# Patient Record
Sex: Male | Born: 1985 | Race: Black or African American | Marital: Single | Smoking: Current every day smoker
Health system: Southern US, Community
[De-identification: ages and names within clinical notes are randomized; demographics above are authoritative.]

## PROBLEM LIST (undated history)

## (undated) DIAGNOSIS — F209 Schizophrenia, unspecified: Secondary | ICD-10-CM

## (undated) DIAGNOSIS — F22 Delusional disorders: Secondary | ICD-10-CM

---

## 2016-02-18 ENCOUNTER — Other Ambulatory Visit: Payer: Self-pay | Admitting: Family Medicine

## 2016-02-18 DIAGNOSIS — R591 Generalized enlarged lymph nodes: Secondary | ICD-10-CM

## 2016-02-26 ENCOUNTER — Ambulatory Visit
Admission: RE | Admit: 2016-02-26 | Discharge: 2016-02-26 | Disposition: A | Discharge status: Home / Self Care | Payer: Medicaid Other | Source: Ambulatory Visit | Attending: Family Medicine | Admitting: Family Medicine

## 2016-02-26 DIAGNOSIS — R591 Generalized enlarged lymph nodes: Secondary | ICD-10-CM | POA: Insufficient documentation

## 2016-03-10 ENCOUNTER — Emergency Department: Payer: Medicaid Other

## 2016-03-10 ENCOUNTER — Emergency Department
Admission: EM | Admit: 2016-03-10 | Discharge: 2016-03-10 | Disposition: A | Discharge status: Home / Self Care | Payer: Medicaid Other | Attending: Emergency Medicine | Admitting: Emergency Medicine

## 2016-03-10 ENCOUNTER — Encounter: Payer: Self-pay | Admitting: Emergency Medicine

## 2016-03-10 DIAGNOSIS — Y999 Unspecified external cause status: Secondary | ICD-10-CM | POA: Insufficient documentation

## 2016-03-10 DIAGNOSIS — Y939 Activity, unspecified: Secondary | ICD-10-CM | POA: Insufficient documentation

## 2016-03-10 DIAGNOSIS — F200 Paranoid schizophrenia: Secondary | ICD-10-CM | POA: Insufficient documentation

## 2016-03-10 DIAGNOSIS — Z79899 Other long term (current) drug therapy: Secondary | ICD-10-CM | POA: Diagnosis not present

## 2016-03-10 DIAGNOSIS — Y92410 Unspecified street and highway as the place of occurrence of the external cause: Secondary | ICD-10-CM | POA: Insufficient documentation

## 2016-03-10 DIAGNOSIS — S20219A Contusion of unspecified front wall of thorax, initial encounter: Secondary | ICD-10-CM | POA: Insufficient documentation

## 2016-03-10 DIAGNOSIS — S40012A Contusion of left shoulder, initial encounter: Secondary | ICD-10-CM

## 2016-03-10 DIAGNOSIS — F17200 Nicotine dependence, unspecified, uncomplicated: Secondary | ICD-10-CM | POA: Insufficient documentation

## 2016-03-10 DIAGNOSIS — M25512 Pain in left shoulder: Secondary | ICD-10-CM | POA: Diagnosis present

## 2016-03-10 DIAGNOSIS — T148XXA Other injury of unspecified body region, initial encounter: Secondary | ICD-10-CM

## 2016-03-10 HISTORY — DX: Delusional disorders: F22

## 2016-03-10 HISTORY — DX: Schizophrenia, unspecified: F20.9

## 2016-03-10 MED ORDER — IBUPROFEN 600 MG PO TABS: 600.0000 mg | ORAL_TABLET | Freq: Four times a day (QID) | ORAL | Status: DC | PRN

## 2016-03-10 MED ORDER — IBUPROFEN 600 MG PO TABS: 600.0000 mg | ORAL_TABLET | Freq: Four times a day (QID) | ORAL | Status: AC | PRN

## 2016-03-10 NOTE — Discharge Instructions (Signed)
Cryotherapy °Cryotherapy is when you put ice on your injury. Ice helps lessen pain and puffiness (swelling) after an injury. Ice works the best when you start using it in the first 24 to 48 hours after an injury. °HOME CARE °· Put a dry or damp towel between the ice pack and your skin. °· You may press gently on the ice pack. °· Leave the ice on for no more than 10 to 20 minutes at a time. °· Check your skin after 5 minutes to make sure your skin is okay. °· Rest at least 20 minutes between ice pack uses. °· Stop using ice when your skin loses feeling (numbness). °· Do not use ice on someone who cannot tell you when it hurts. This includes small children and people with memory problems (dementia). °GET HELP RIGHT AWAY IF: °· You have white spots on your skin. °· Your skin turns blue or pale. °· Your skin feels waxy or hard. °· Your puffiness gets worse. °MAKE SURE YOU:  °· Understand these instructions. °· Will watch your condition. °· Will get help right away if you are not doing well or get worse. °  °This information is not intended to replace advice given to you by your health care provider. Make sure you discuss any questions you have with your health care provider. °  °Document Released: 03/02/2008 Document Revised: 12/07/2011 Document Reviewed: 05/07/2011 °Elsevier Interactive Patient Education ©2016 [employer] ° °Contusion °A contusion is a deep bruise. Contusions happen when an injury causes bleeding under the skin. Symptoms of bruising include pain, swelling, and discolored skin. The skin may turn blue, purple, or yellow. °HOME CARE  °· Rest the injured area. °· If told, put ice on the injured area. °¨ Put ice in a plastic bag. °¨ Place a towel between your skin and the bag. °¨ Leave the ice on for 20 minutes, 2-3 times per day. °· If told, put light pressure (compression) on the injured area using an elastic bandage. Make sure the bandage is not too tight. Remove it and put it back on as told by your  doctor. °· If possible, raise (elevate) the injured area above the level of your heart while you are sitting or lying down. °· Take over-the-counter and prescription medicines only as told by your doctor. °GET HELP IF: °· Your symptoms do not get better after several days of treatment. °· Your symptoms get worse. °· You have trouble moving the injured area. °GET HELP RIGHT AWAY IF:  °· You have very bad pain. °· You have a loss of feeling (numbness) in a hand or foot. °· Your hand or foot turns pale or cold. °  °This information is not intended to replace advice given to you by your health care provider. Make sure you discuss any questions you have with your health care provider. °  °Document Released: 03/02/2008 Document Revised: 06/05/2015 Document Reviewed: 01/30/2015 °Elsevier Interactive Patient Education ©2016 [employer] ° °
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
## 2018-04-22 IMAGING — US US SOFT TISSUE HEAD/NECK
1 series · 12 of 12 positions shown · non-contrast
Comparison: None.

CLINICAL DATA: Right supraclavicular mass

EXAM:
ULTRASOUND OF HEAD/NECK SOFT TISSUES
TECHNIQUE: Ultrasound examination of the head and neck soft tissues was
performed in the area of clinical concern.

[Series 1: us soft tissue head/neck · 0.04mm/px · 12 of 12 slices shown]
[im 1/12]
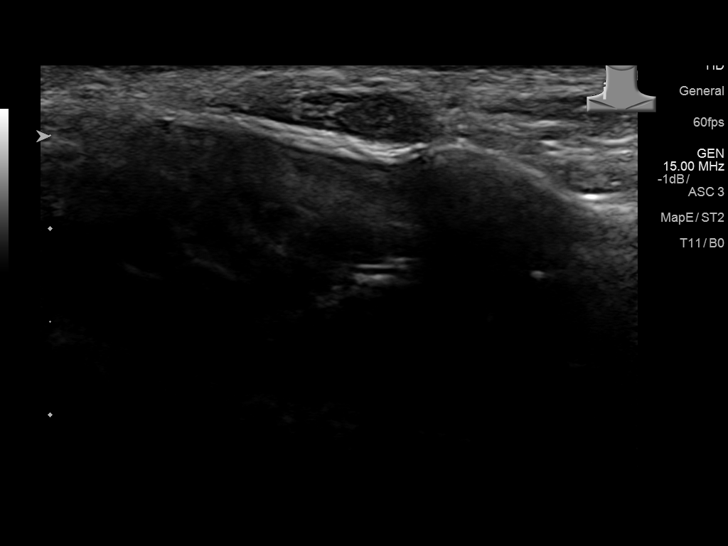
[im 2/12]
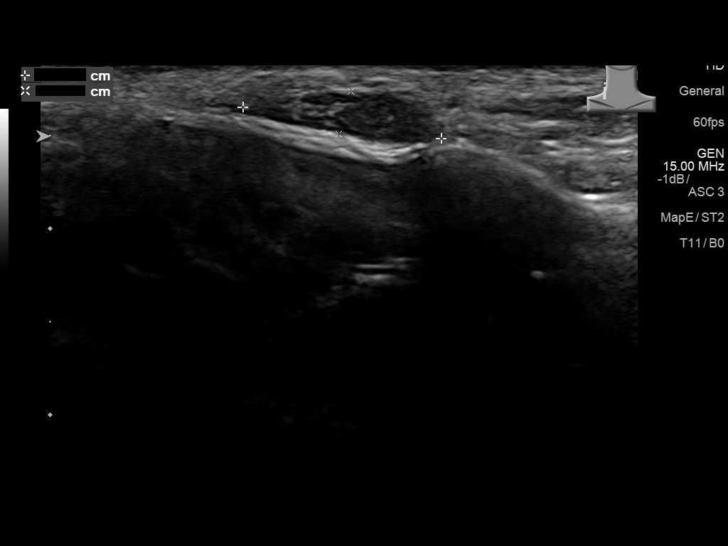
[im 3/12]
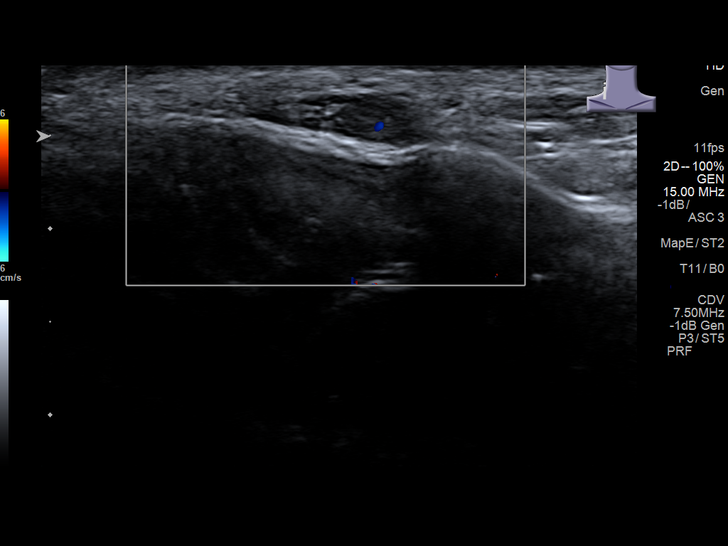
[im 4/12]
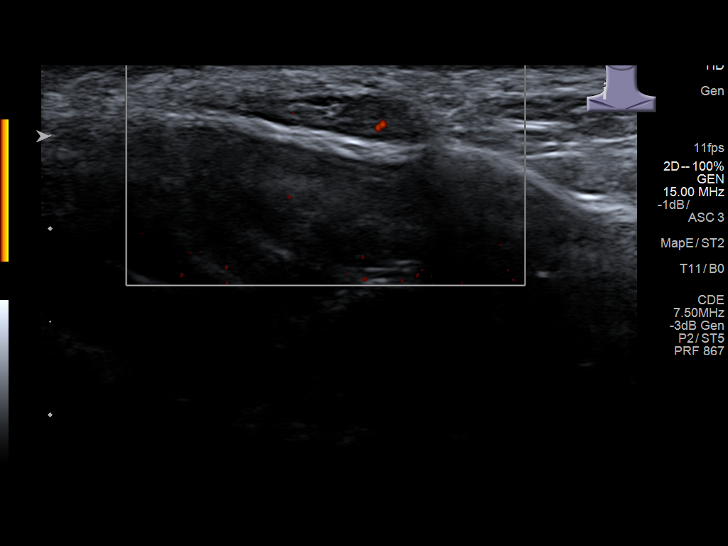
[im 5/12]
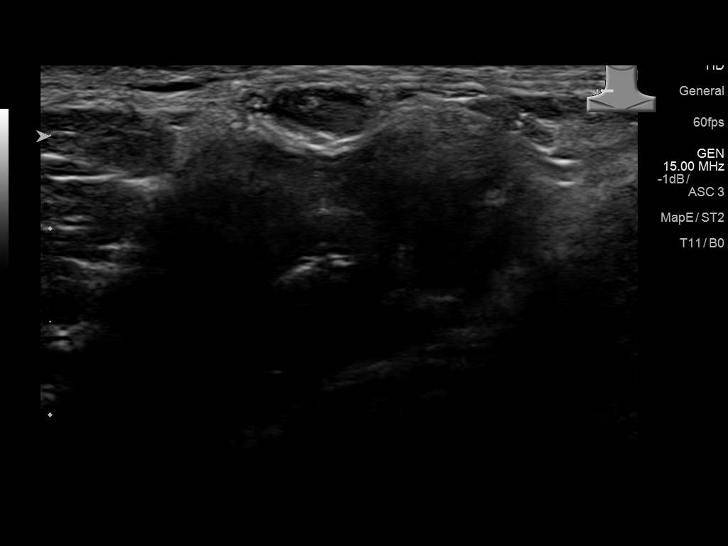
[im 6/12]
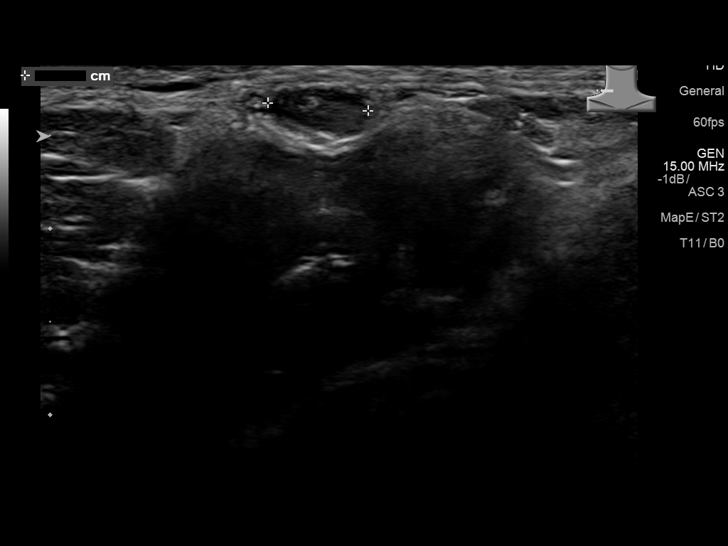
[im 7/12]
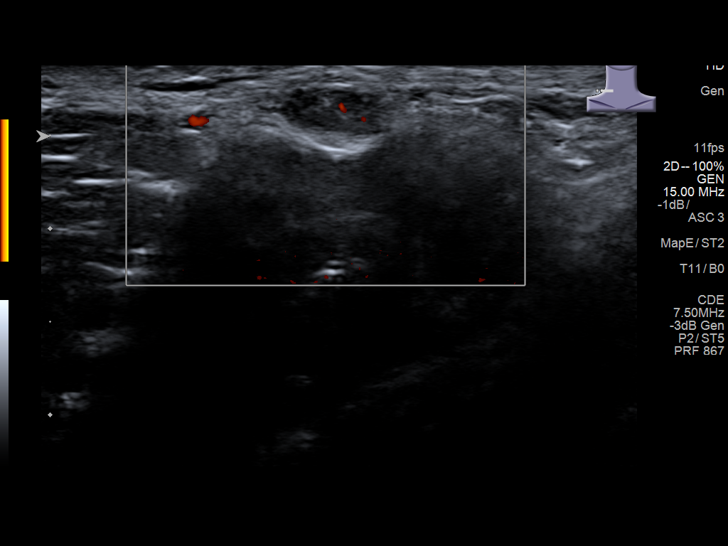
[im 8/12]
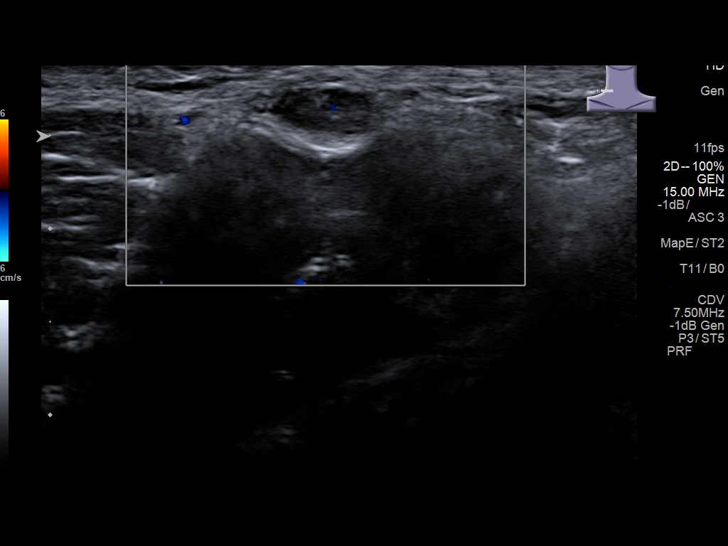
[im 9/12]
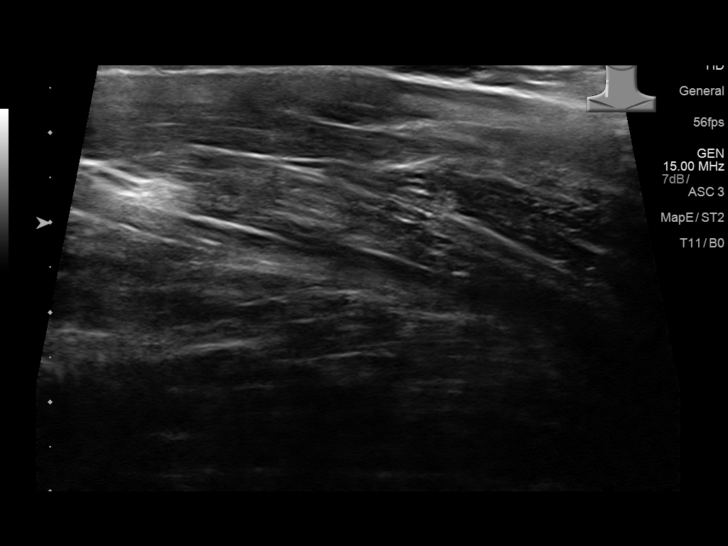
[im 10/12]
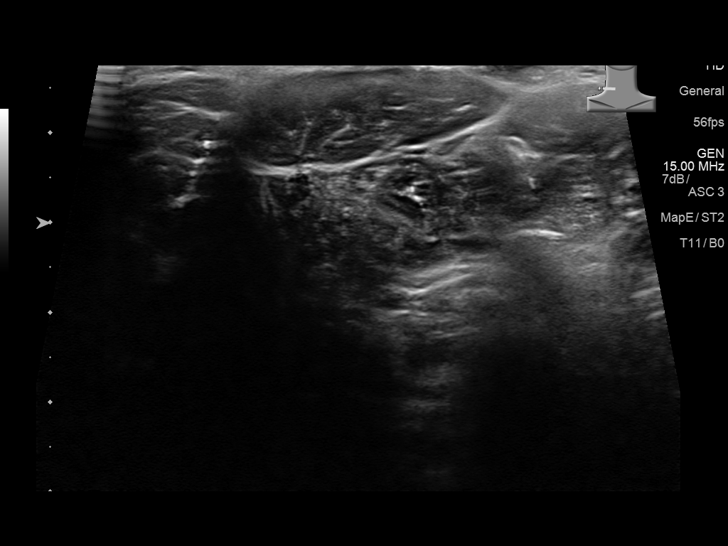
[im 11/12]
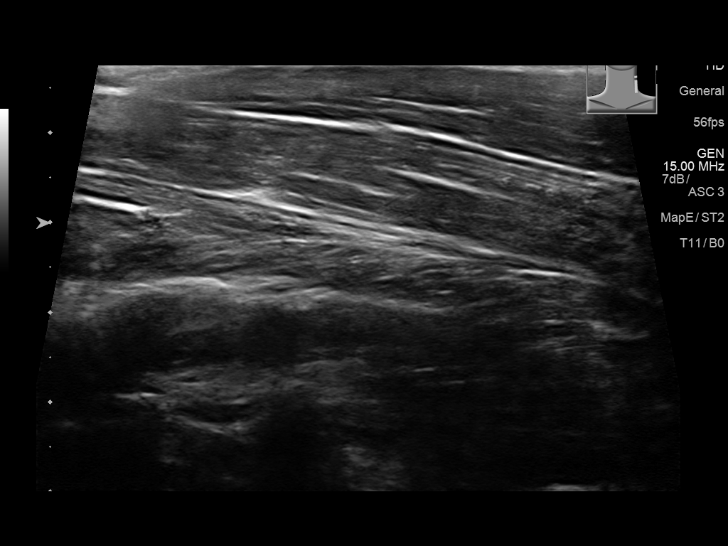
[im 12/12]
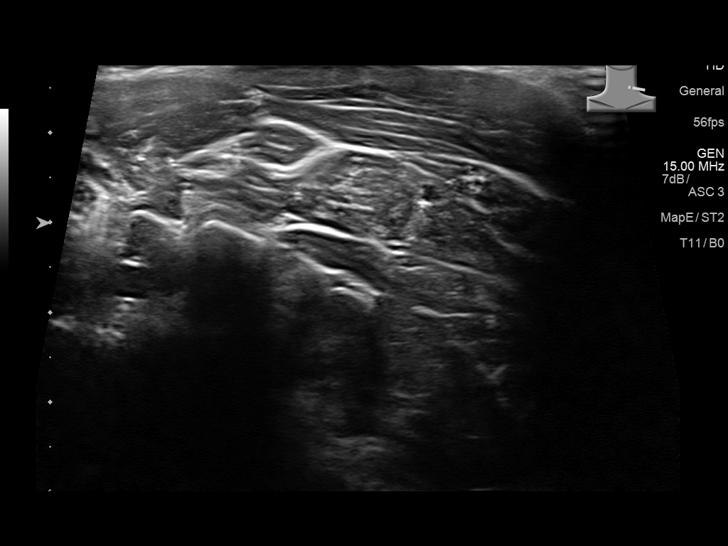

[12 of 12 positions shown; findings below may reference images not displayed]

FINDINGS: The palpable abnormality corresponds to a 1.1 x 0.2 x 0.5 cm
hypoechoic heterogeneous mass there is some vascularity. A fatty
hilum is suspected.
IMPRESSION: The palpable abnormality most likely corresponds to a normal
appearing lymph node with a fatty hilum.
